# Patient Record
Sex: Male | Born: 1990 | Race: Black or African American | Hispanic: No | Marital: Single | Smoking: Never smoker
Health system: Southern US, Community
[De-identification: ages and names within clinical notes are randomized; demographics above are authoritative.]

## PROBLEM LIST (undated history)

## (undated) ENCOUNTER — Emergency Department (HOSPITAL_BASED_OUTPATIENT_CLINIC_OR_DEPARTMENT_OTHER): Admission: EM | Payer: Self-pay | Source: Home / Self Care

---

## 2010-12-10 ENCOUNTER — Emergency Department (HOSPITAL_BASED_OUTPATIENT_CLINIC_OR_DEPARTMENT_OTHER)
Admission: EM | Admit: 2010-12-10 | Discharge: 2010-12-10 | Payer: Self-pay | Source: Home / Self Care | Admitting: Emergency Medicine

## 2011-02-23 LAB — CULTURE, ROUTINE-ABSCESS

## 2018-01-26 ENCOUNTER — Other Ambulatory Visit: Payer: Self-pay

## 2018-01-26 ENCOUNTER — Emergency Department (HOSPITAL_BASED_OUTPATIENT_CLINIC_OR_DEPARTMENT_OTHER)
Admission: EM | Admit: 2018-01-26 | Discharge: 2018-01-26 | Disposition: A | Discharge status: Home / Self Care | Payer: Self-pay | Attending: Emergency Medicine | Admitting: Emergency Medicine

## 2018-01-26 ENCOUNTER — Emergency Department (HOSPITAL_BASED_OUTPATIENT_CLINIC_OR_DEPARTMENT_OTHER): Payer: Self-pay

## 2018-01-26 ENCOUNTER — Encounter (HOSPITAL_BASED_OUTPATIENT_CLINIC_OR_DEPARTMENT_OTHER): Payer: Self-pay | Admitting: Emergency Medicine

## 2018-01-26 DIAGNOSIS — R69 Illness, unspecified: Secondary | ICD-10-CM

## 2018-01-26 DIAGNOSIS — J111 Influenza due to unidentified influenza virus with other respiratory manifestations: Secondary | ICD-10-CM | POA: Insufficient documentation

## 2018-01-26 DIAGNOSIS — R030 Elevated blood-pressure reading, without diagnosis of hypertension: Secondary | ICD-10-CM

## 2018-01-26 DIAGNOSIS — I10 Essential (primary) hypertension: Secondary | ICD-10-CM | POA: Insufficient documentation

## 2018-01-26 LAB — INFLUENZA PANEL BY PCR (TYPE A & B)
INFLAPCR: POSITIVE — AB
Influenza B By PCR: NEGATIVE

## 2018-01-26 MED ORDER — OSELTAMIVIR PHOSPHATE 75 MG PO CAPS: 75.0000 mg | ORAL_CAPSULE | Freq: Two times a day (BID) | ORAL | 0 refills | Status: AC

## 2018-01-26 MED ORDER — IBUPROFEN 800 MG PO TABS: 800.0000 mg | ORAL_TABLET | Freq: Three times a day (TID) | ORAL | 0 refills | Status: AC

## 2018-01-26 MED ORDER — FLUTICASONE PROPIONATE 50 MCG/ACT NA SUSP: 1.0000 | Freq: Every day | NASAL | 2 refills | Status: AC

## 2018-01-26 MED ORDER — BENZONATATE 100 MG PO CAPS: 100.0000 mg | ORAL_CAPSULE | Freq: Three times a day (TID) | ORAL | 0 refills | Status: AC

## 2018-01-26 NOTE — ED Notes (Signed)
NAD at this time. Pt is stable and going home.  

## 2018-01-26 NOTE — ED Provider Notes (Signed)
[facility] EMERGENCY DEPARTMENT Provider Note   CSN: [id number] Arrival date & time: 01/26/18  1645     History   Chief Complaint Chief Complaint  Patient presents with  . Fever  . Cough    HPI Hayden Ryan is a 27 y.o. male without significant past medical history who presents to the ED with influenza like sxs that started yesterday afternoon. Patient states he is experiencing congestion, rhinorrhea, productive cough with green mucous sputum, chills, subjective fevers, generalized body aches, and fatigue. Taking Dayquil and Nyquil with some relief. No other alleviating factors. Has had some diarrhea, non bloody. Denies chest pain, dypsnea, sore throat, vomiting, or abdominal pain. Patient has not had any recent sick contacts. He did not receive his influenza vaccine this year.   HPI  History reviewed. No pertinent past medical history.  There are no active problems to display for this patient.   History reviewed. No pertinent surgical history.     Home Medications    Prior to Admission medications   Not on File    Family History History reviewed. No pertinent family history.  Social History Social History   Tobacco Use  . Smoking status: Never Smoker  . Smokeless tobacco: Never Used  Substance Use Topics  . Alcohol use: Yes  . Drug use: No     Allergies   Patient has no known allergies.   Review of Systems Review of Systems  Constitutional: Positive for chills, fatigue and fever (subjective).  HENT: Positive for congestion and rhinorrhea. Negative for ear pain and sore throat.   Eyes: Negative for visual disturbance.  Respiratory: Positive for cough. Negative for shortness of breath.   Cardiovascular: Negative for chest pain and leg swelling.  Gastrointestinal: Positive for diarrhea. Negative for abdominal pain, blood in stool and vomiting.  Musculoskeletal: Positive for myalgias (generalized).  Neurological: Positive for weakness  (generalized).  All other systems reviewed and are negative.    Physical Exam Updated Vital Signs BP (!) 157/88 (BP Location: Left Arm)   Pulse 100   Temp 98.9 F (37.2 C) (Oral)   Resp 20   Ht 6\' 2"  (1.88 m)   Wt 111.1 kg (245 lb)   SpO2 96%   BMI 31.46 kg/m   Physical Exam  Constitutional: He appears well-developed and well-nourished.  Non-toxic appearance. No distress.  HENT:  Head: Normocephalic and atraumatic.  Right Ear: Tympanic membrane is not perforated, not erythematous, not retracted and not bulging.  Left Ear: Tympanic membrane is not perforated, not erythematous, not retracted and not bulging.  Nose: Mucosal edema present. Right sinus exhibits no maxillary sinus tenderness and no frontal sinus tenderness. Left sinus exhibits no maxillary sinus tenderness and no frontal sinus tenderness.  Mouth/Throat: Uvula is midline and mucous membranes are normal. Posterior oropharyngeal erythema present. No oropharyngeal exudate. Tonsils are 2+ on the right. Tonsils are 2+ on the left.  Eyes: Conjunctivae are normal. Pupils are equal, round, and reactive to light. Right eye exhibits no discharge. Left eye exhibits no discharge.  Neck: Normal range of motion. Neck supple.  Cardiovascular: Normal rate and regular rhythm.  No murmur heard. Pulmonary/Chest: Breath sounds normal. No respiratory distress. He has no wheezes. He has no rales.  Abdominal: Soft. He exhibits no distension. There is no tenderness.  Lymphadenopathy:    He has no cervical adenopathy.  Neurological: He is alert.  Clear speech.   Skin: Skin is warm and dry. No rash noted.  Psychiatric: He has a normal  mood and affect. His behavior is normal.  Nursing note and vitals reviewed.  ED Treatments / Results  Labs (all labs ordered are listed, but only abnormal results are displayed) Labs Reviewed - No data to display  EKG  EKG Interpretation None       Radiology Dg Chest 2 View  Result Date:  01/26/2018 CLINICAL DATA:  Fever, chills, cough. EXAM: CHEST  2 VIEW COMPARISON:  None. FINDINGS: The heart size and mediastinal contours are within normal limits. Both lungs are clear. The visualized skeletal structures are unremarkable. IMPRESSION: No active cardiopulmonary disease. Electronically Signed   By: Elsie Stain M.D.   On: 01/26/2018 18:04    Procedures Procedures (including critical care time)  Medications Ordered in ED Medications - No data to display   Initial Impression / Assessment and Plan / ED Course  I have reviewed the triage vital signs and the nursing notes.  Pertinent labs & imaging results that were available during my care of the patient were reviewed by me and considered in my medical decision making (see chart for details).  Patient presents with symptoms consistent with influenza like illness. He is nontoxic appearing, in no apparent distress, vitals WNL other than elevated BP- no indication of HTN emergency discussed need for recheck. Patient is afebrile and without adventitious sounds on lung exam, CXR negative for infiltrate, doubt PNA. No wheezing on exam. Afebrile, no sinus tenderness, doubt sinusitis. Centor score 0, doubt strep pharyngitis. Symptoms appear consistent with influenza, flu swab is pending, at this time, given sxs onset within past 24 hours will provide prescription for tamiflu and call patient with results. Will also treat supportively with Ibuprofen, Flonase, and Tessalon. I discussed results, treatment plan, need for PCP follow-up, and return precautions with the patient. Provided opportunity for questions, patient confirmed understanding and is in agreement with plan.   Vitals:   01/26/18 1655 01/26/18 1656 01/26/18 1839  BP: (!) 157/88  (!) 158/90  Pulse: 100  92  Resp: 20  18  Temp: 98.9 F (37.2 C)    TempSrc: Oral    SpO2: 96%  98%  Weight:  111.1 kg (245 lb)   Height:  6\' 2"  (1.88 m)     Final Clinical Impressions(s) / ED  Diagnoses   Final diagnoses:  Influenza-like illness  Elevated blood pressure reading    ED Discharge Orders        Ordered    oseltamivir (TAMIFLU) 75 MG capsule  Every 12 hours     01/26/18 1831    fluticasone (FLONASE) 50 MCG/ACT nasal spray  Daily     01/26/18 1831    benzonatate (TESSALON) 100 MG capsule  Every 8 hours     01/26/18 1831    ibuprofen (ADVIL,MOTRIN) 800 MG tablet  3 times daily     01/26/18 1831       Imogene Gravelle, [place] R, PA-C 01/26/18 1844    Charlynne Pander, MD 01/26/18 2222

## 2018-01-26 NOTE — ED Triage Notes (Signed)
Pt reports fever, chills body aches x 1 hour, took  Tylenol, dayquil 1 hour ago. deneis chest pain nor sore throat.

## 2018-01-26 NOTE — Discharge Instructions (Signed)
You were seen in the emergency today symptoms that are suspicious for influenza.  Your flu swab is pending at this time.  We will call you with the results when she notes it is positive.  I have prescribed you multiple medications to treat your symptoms.   - Tamiflu this medication is used to treat influenza, take this once in the morning and once in the evening.  We will call you with the results, if they are negative stop taking this medicine.  -Flonase to be used 1 spray in each nostril daily.  This medication is used to treat your congestion.  -Tessalon can be taken once every 8 hours as needed.  This medication is used to treat your cough.  -Ibuprofen to be taken once every 8 hours as needed for pain.  I have prescribed a new medications for you today. It is important that when you pick the prescription up you discuss the potential interactions of this medication with other medications you are taking, including over the counter medications, with the pharmacists. If you are taking coumadin this medication will likely alter your coumadin level. If I have prescribed you an antibiotic and you take birth control this will deactivate your birth control for 2 weeks.   This new medication has potential side effects. Be sure to contact your primary care provider or return to the emergency department if you are experiencing new symptoms that you are unable to tolerate after starting the medication. You need to receive medical evaluation immediately if you start to experience blistering of the skin, rash, swelling, or difficulty breathing as these signs could indicate a more serious medication side effect.    Additionally your blood pressure was elevated in the emergency department today.  You will need to have this rechecked by her primary care provider.  If you do not have a primary care provider please see the options your discharge instructions.  You will need to follow-up with your primary care  provider in 1 week if your symptoms have not improved.   Return to the emergency department for any new or worsening symptoms including but not limited to difficulty breathing, chest pain, or passing out, or any other concerns you may have.

## 2019-02-14 IMAGING — DX DG CHEST 2V
2 series · 2 of 2 positions shown · non-contrast
Comparison: None.

CLINICAL DATA: Fever, chills, cough.

EXAM:
CHEST  2 VIEW

[chest pa]
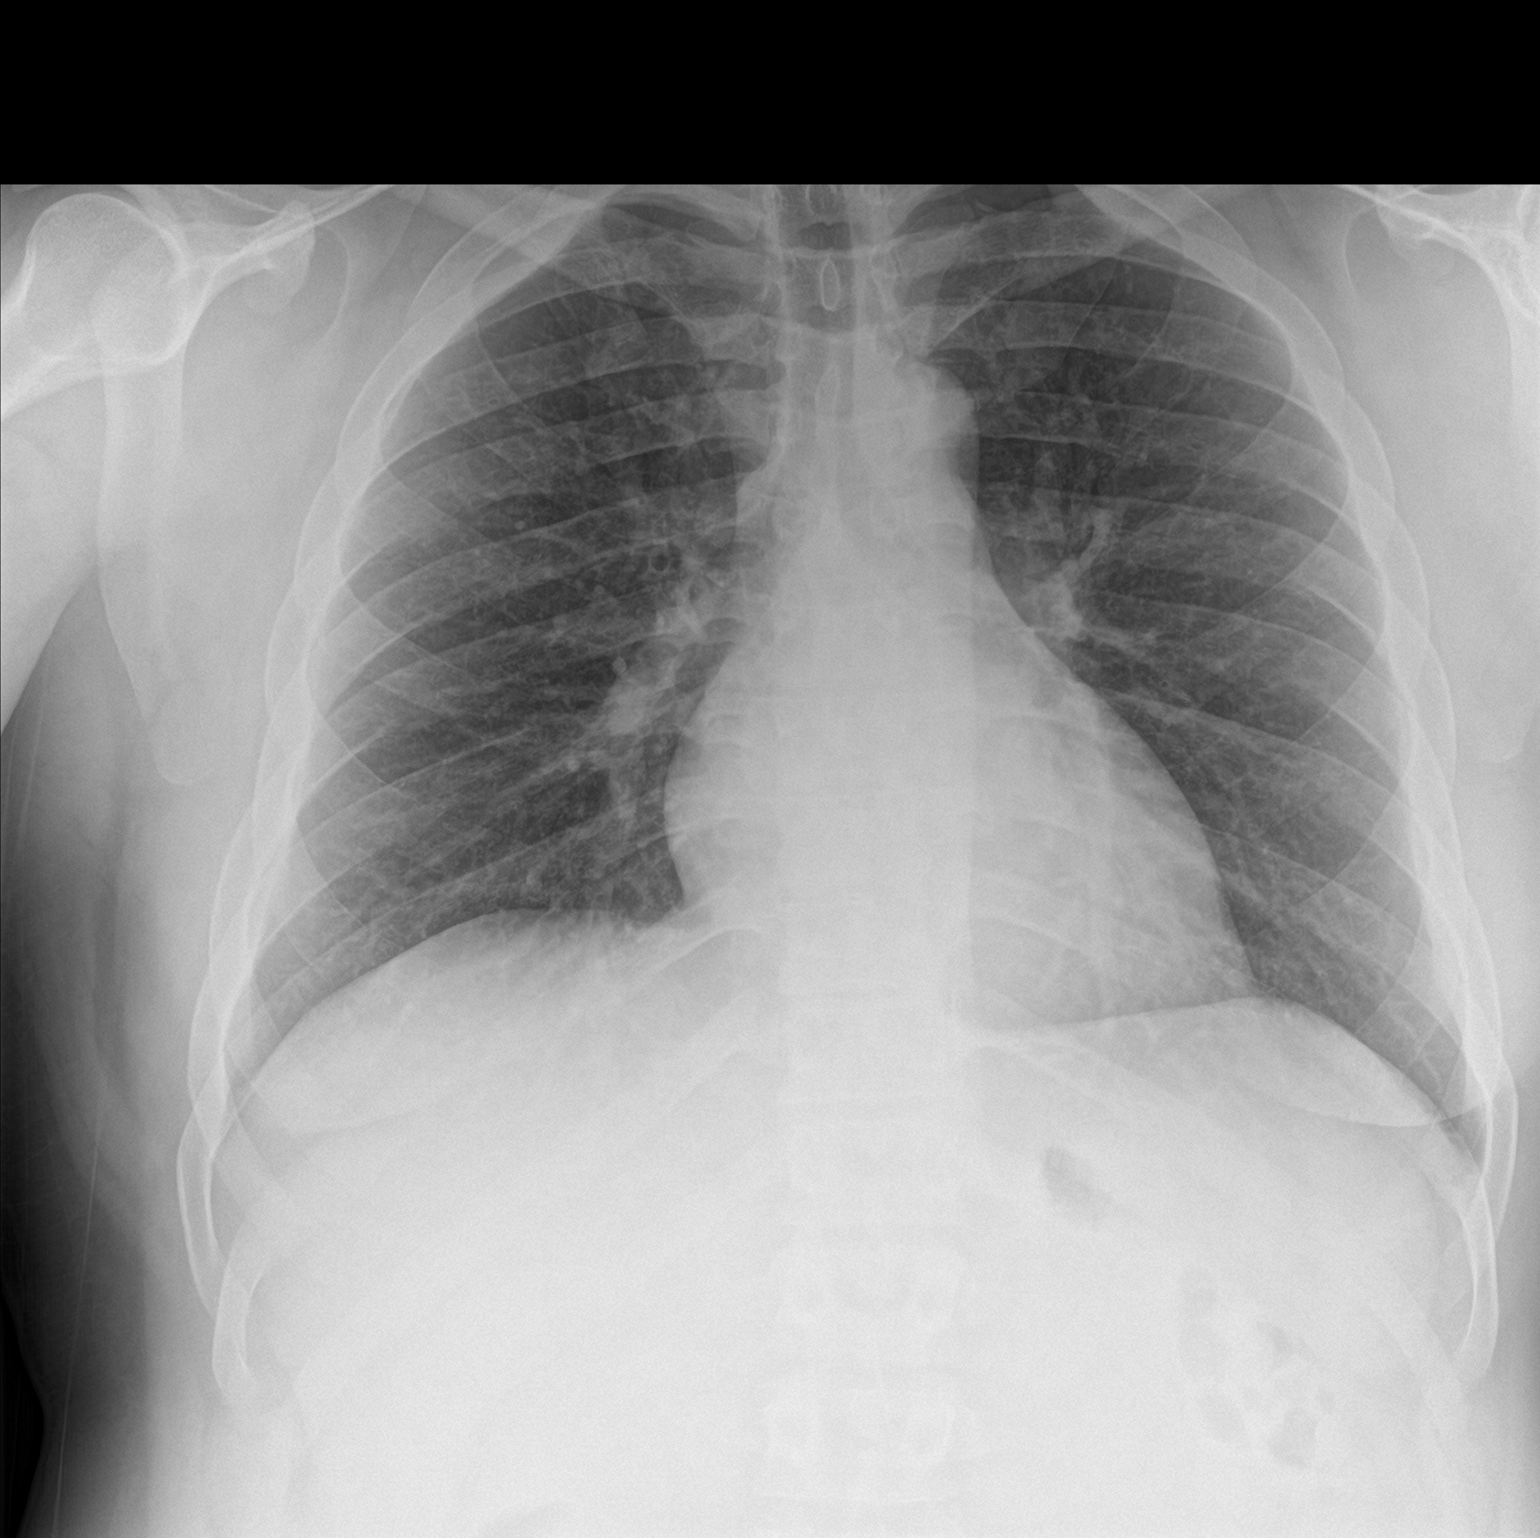

[chest lat]
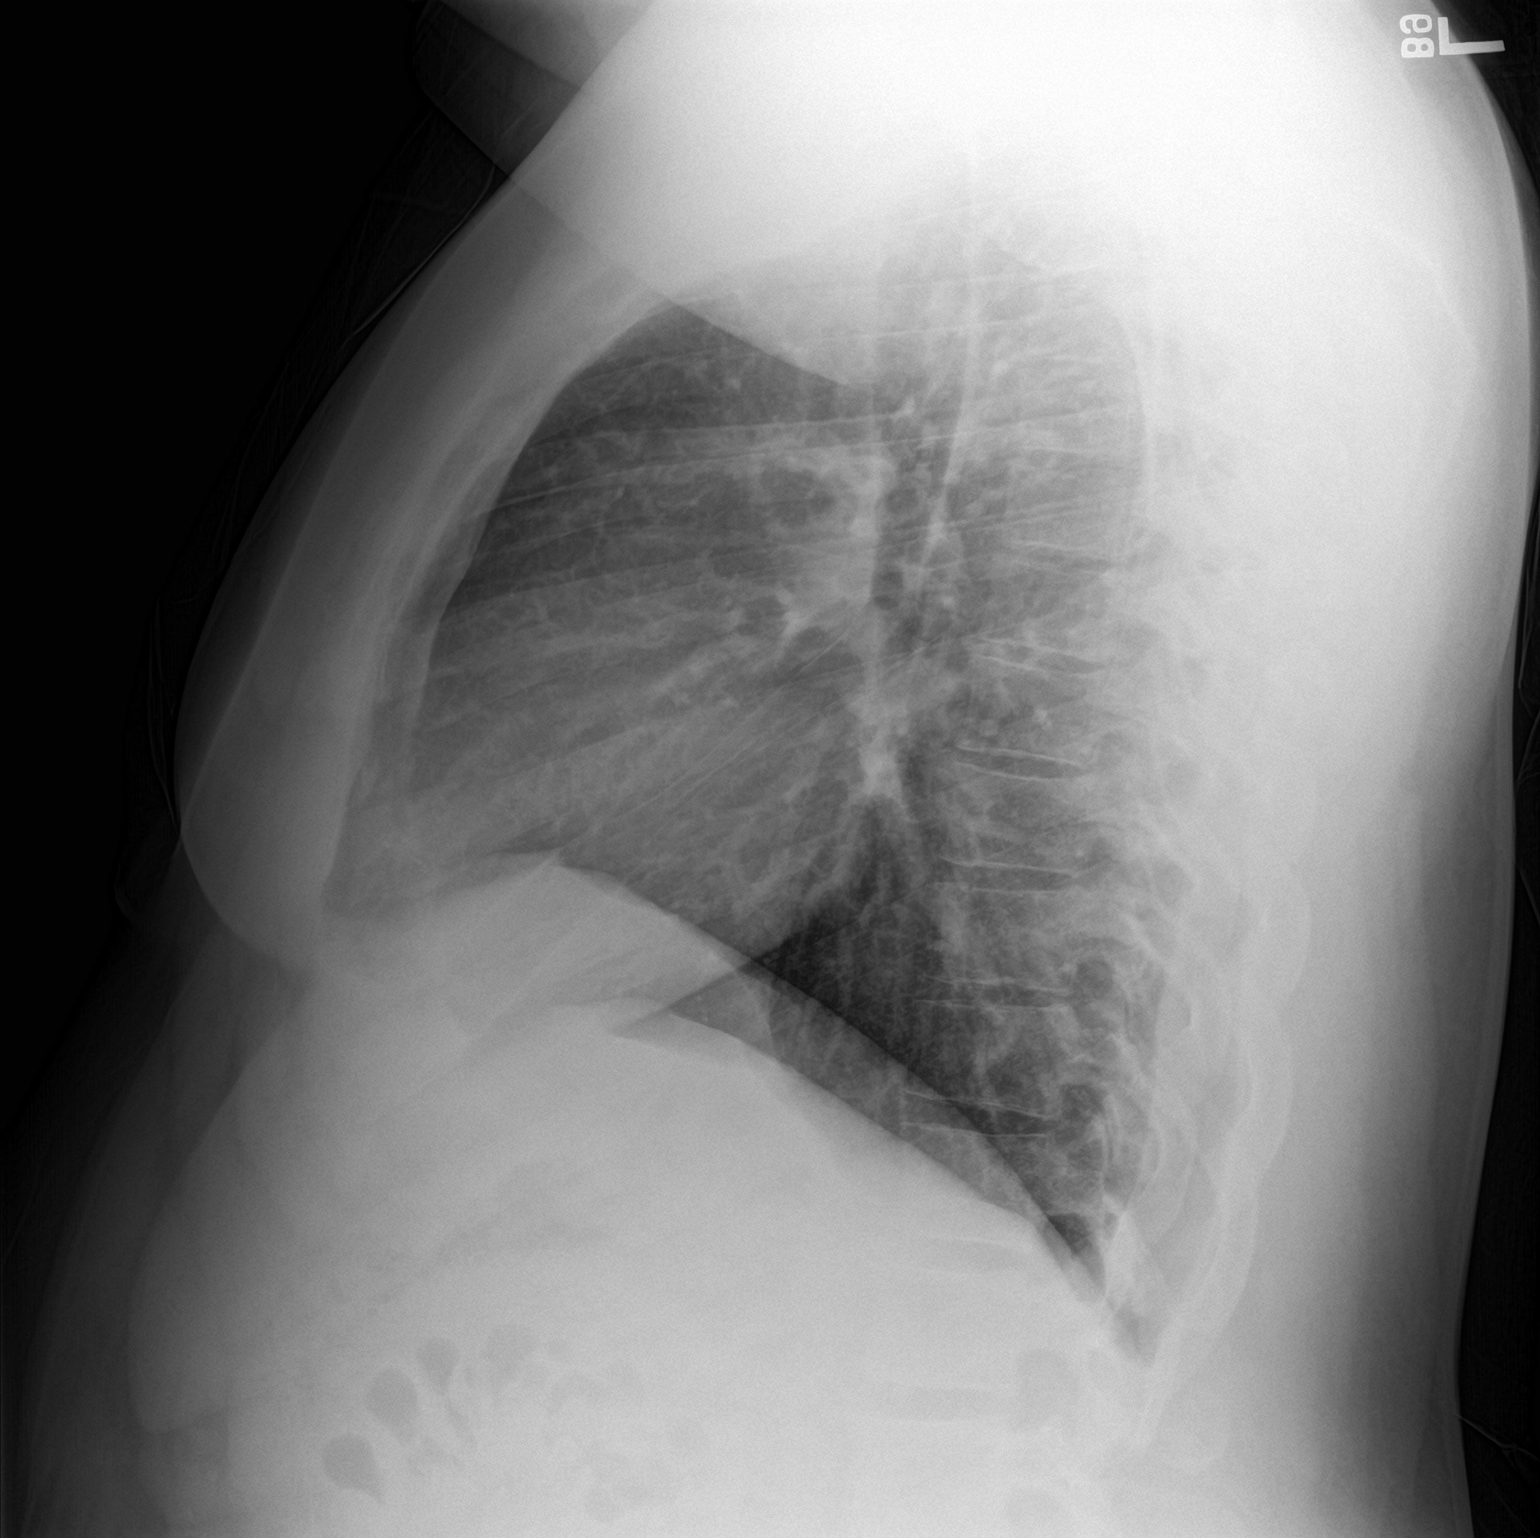

[2 of 2 positions shown; findings below may reference images not displayed]

FINDINGS: The heart size and mediastinal contours are within normal limits.
Both lungs are clear. The visualized skeletal structures are
unremarkable.
IMPRESSION: No active cardiopulmonary disease.

## 2022-04-30 ENCOUNTER — Other Ambulatory Visit: Payer: Self-pay
